# Patient Record
Sex: Female | Born: 1980 | Race: White | Hispanic: No | Marital: Single | Smoking: Never smoker
Health system: Southern US, Community
[De-identification: ages and names within clinical notes are randomized; demographics above are authoritative.]

---

## 2015-04-03 ENCOUNTER — Emergency Department (HOSPITAL_BASED_OUTPATIENT_CLINIC_OR_DEPARTMENT_OTHER): Payer: Self-pay

## 2015-04-03 ENCOUNTER — Emergency Department (HOSPITAL_BASED_OUTPATIENT_CLINIC_OR_DEPARTMENT_OTHER)
Admission: EM | Admit: 2015-04-03 | Discharge: 2015-04-03 | Disposition: A | Discharge status: Home / Self Care | Payer: Self-pay | Attending: Emergency Medicine | Admitting: Emergency Medicine

## 2015-04-03 ENCOUNTER — Encounter (HOSPITAL_BASED_OUTPATIENT_CLINIC_OR_DEPARTMENT_OTHER): Payer: Self-pay | Admitting: *Deleted

## 2015-04-03 DIAGNOSIS — W12XXXA Fall on and from scaffolding, initial encounter: Secondary | ICD-10-CM | POA: Insufficient documentation

## 2015-04-03 DIAGNOSIS — Y9289 Other specified places as the place of occurrence of the external cause: Secondary | ICD-10-CM | POA: Insufficient documentation

## 2015-04-03 DIAGNOSIS — Y9389 Activity, other specified: Secondary | ICD-10-CM | POA: Insufficient documentation

## 2015-04-03 DIAGNOSIS — S32020A Wedge compression fracture of second lumbar vertebra, initial encounter for closed fracture: Secondary | ICD-10-CM | POA: Insufficient documentation

## 2015-04-03 DIAGNOSIS — S52592A Other fractures of lower end of left radius, initial encounter for closed fracture: Secondary | ICD-10-CM | POA: Insufficient documentation

## 2015-04-03 DIAGNOSIS — Y990 Civilian activity done for income or pay: Secondary | ICD-10-CM | POA: Insufficient documentation

## 2015-04-03 DIAGNOSIS — S52502A Unspecified fracture of the lower end of left radius, initial encounter for closed fracture: Secondary | ICD-10-CM

## 2015-04-03 MED ORDER — FENTANYL CITRATE (PF) 100 MCG/2ML IJ SOLN
100.0000 ug | Freq: Once | INTRAMUSCULAR | Status: AC
  Administered 2015-04-03: 100 ug via NASAL
  Filled 2015-04-03: qty 2

## 2015-04-03 MED ORDER — OXYCODONE-ACETAMINOPHEN 5-325 MG PO TABS
1.0000 | ORAL_TABLET | Freq: Once | ORAL | Status: AC
  Administered 2015-04-03: 1 via ORAL
  Filled 2015-04-03: qty 1

## 2015-04-03 MED ORDER — CEPHALEXIN 250 MG PO CAPS
1000.0000 mg | ORAL_CAPSULE | Freq: Once | ORAL | Status: AC
  Administered 2015-04-03: 1000 mg via ORAL
  Filled 2015-04-03: qty 4

## 2015-04-03 MED ORDER — OXYCODONE-ACETAMINOPHEN 7.5-325 MG PO TABS: 1.0000 | ORAL_TABLET | ORAL | Status: AC | PRN

## 2015-04-03 MED ORDER — CEPHALEXIN 500 MG PO CAPS: 500.0000 mg | ORAL_CAPSULE | Freq: Three times a day (TID) | ORAL | Status: AC

## 2015-04-03 MED ORDER — OXYCODONE-ACETAMINOPHEN 5-325 MG PO TABS
2.0000 | ORAL_TABLET | Freq: Once | ORAL | Status: AC
  Administered 2015-04-03: 2 via ORAL
  Filled 2015-04-03: qty 2

## 2015-04-03 MED ORDER — HYDROMORPHONE HCL 2 MG PO TABS
2.0000 mg | ORAL_TABLET | Freq: Once | ORAL | Status: DC
  Filled 2015-04-03: qty 1

## 2015-04-03 NOTE — ED Provider Notes (Signed)
CSN: [id number]     Arrival date & time 04/03/15  0006 History   First MD Initiated Contact with Patient 04/03/15 0029     Chief Complaint  Patient presents with  . Fall     (Consider location/radiation/quality/duration/timing/severity/associated sxs/prior Treatment) Patient is a 35 y.o. female presenting with fall. The history is provided by the patient.  Fall  She isn't excited cancer and fell proximally 20 feet off of alcohol. She injured her left wrist and her lower back. She rates pain at 9/10. She denies other injury.  History reviewed. No pertinent past medical history. History reviewed. No pertinent past surgical history. History reviewed. No pertinent family history. Social History  Substance Use Topics  . Smoking status: Never Smoker   . Smokeless tobacco: None  . Alcohol Use: Yes   OB History    No data available     Review of Systems  All other systems reviewed and are negative.     Allergies  Review of patient's allergies indicates no known allergies.  Home Medications   Prior to Admission medications   Not on File   BP 122/70 mmHg  Pulse 84  Temp(Src) 98.7 F (37.1 C) (Oral)  Resp 16  Ht  (1.6 m)  Wt 126 lb (57.153 kg)  BMI 22.33 kg/m2  SpO2 100%  LMP 03/17/2015 Physical Exam  Nursing note and vitals reviewed.  35 year old female, resting comfortably and in no acute distress. Vital signs are normal. Oxygen saturation is 100%, which is normal. Head is normocephalic and atraumatic. PERRLA, EOMI. Oropharynx is clear. Neck is nontender and supple without adenopathy or JVD. Back is moderately tender in the mid and upper lumbar area without point tenderness. There is no CVA tenderness. Lungs are clear without rales, wheezes, or rhonchi. Chest is nontender. Heart has regular rate and rhythm without murmur. Abdomen is soft, flat, nontender without masses or hepatosplenomegaly and peristalsis is normoactive. Extremities: Deformity is present in  the left wrist with dorsal angulation. There is a 3 mm break in the skin on the ulnar aspect worrisome for open fracture. Distal neurovascular exam is intact with normal sensation, prompt capillary refill, normal strength of intrinsic muscles of the hand. Skin is warm and dry without rash. Neurologic: Mental status is normal, cranial nerves are intact, there are no motor or sensory deficits.  ED Course  .Splint Application Date/Time: 04/03/2015 2:00 AM Performed by: Dione Booze Authorized by: Preston Fleeting, Daquana Paddock Consent: Verbal consent obtained. Written consent not obtained. Risks and benefits: risks, benefits and alternatives were discussed Consent given by: patient Patient understanding: patient states understanding of the procedure being performed Patient consent: the patient's understanding of the procedure matches consent given Procedure consent: procedure consent matches procedure scheduled Relevant documents: relevant documents present and verified Test results: test results available and properly labeled Site marked: the operative site was marked Imaging studies: imaging studies available Required items: required blood products, implants, devices, and special equipment available Patient identity confirmed: verbally with patient and arm band Location details: left wrist Splint type: sugar tong Supplies used: cotton padding,  elastic bandage and Ortho-Glass Post-procedure: The splinted body part was neurovascularly unchanged following the procedure. Patient tolerance: Patient tolerated the procedure well with no immediate complications Comments: Splint applied by ED technician. I evaluated the neurovascular status following splint application.   (including critical care time)  Imaging Review Dg Wrist Complete Left  04/03/2015  CLINICAL DATA:  Patient status post fall from pole. Left wrist injury. EXAM: LEFT  WRIST - COMPLETE 3+ VIEW COMPARISON:  None. FINDINGS: There is a comminuted  foreshortened impacted fracture of the distal radius with intra-articular extension. There is dorsal displacement at the fracture site. Overlying soft tissue swelling. No evidence for associated acute fractures. IMPRESSION: Comminuted displaced and impacted fracture of the distal radius with intra-articular extension and dorsal displacement. Electronically Signed   By: Annia Belt M.D.   On: 04/03/2015 00:33   I have personally reviewed and evaluated these images as part of my medical decision-making.  MDM   Final diagnoses:  Fall from scaffold, initial encounter  Closed fracture of left distal radius, initial encounter  Traumatic compression fracture of L2 lumbar vertebra, closed, initial encounter (HCC)    Comminuted fracture of the distal right radius. Although there is a break in the skin, is on the ulnar side and there is no ulnar fracture so likely that of open fracture is very small. She is sent for lumbar spine x-ray because of complaint of low back pain.  Lumbar spine x-ray shows compression fracture of T2. She is also now complaining of pain along the iliac crests. This area was not viewed adequately with the lumbar spine x-rays. She is sent for CT of the lumbar spine to evaluate her fracture and plain x-rays of the iliac crest. 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 This mechanism of fall is consistent with the lumbar spine injury identified.  CT scan shows a burst fracture with some posterior retropulsion and probable small epidural hematoma but no evidence of cord or cauda equina compression. Pelvis x-ray is negative.  Patient is from [place], [place]. She once to go back there to receive her treatment. I discussed the case with Dr. Melvyn Novas of hand surgery service who felt that there was no contraindication to her traveling to [place] for delayed treatment  of her wrist fracture and states that open reduction internal fixation Be done week or more following injury without any deleterious effect. She was placed in a sugar tong splint and given a sling. Case was discussed with Dr. Conchita Paris, neurosurgeon, who reviewed her CT scan. I asked him specifically whether patient would be stable enough to take a commercial plane flight to [place] and whether there would be any danger and delaying treatment. He stated that treatment would normally be with TLSO brace and that there was not a contraindication to travel other than that she is likely to be uncomfortable while sitting. He stresses that the TLSO brace has to be worn anytime she is upright-either sitting or standing. She has a friend here who is going to drive her back to [place]. We attempted to get orthopedic rep to come to apply TLSO brace but this was not able to be accomplished for several hours. She is discharged with prescription to obtain TLSO brace and also prescription for oxycodone-acetaminophen. Advised on symptoms that might indicate possible spinal cord or cauda equina compression advised to go to an emergency department immediately if any of those were to occur and she expressed understanding.    Dione Booze, MD 04/03/15 [phone]

## 2015-04-03 NOTE — ED Notes (Signed)
Due to pt having fentanyl right before applying the sugar tong splint, she may or may not remember instructions, but instructions were also given to her visitor that was with her. CMS was intact before and after application of splint. The visitor said he understood and did not have any questions.

## 2015-04-03 NOTE — ED Notes (Signed)
Pt c/o fall from pole injuring left wrist, positive deformity

## 2015-04-03 NOTE — ED Notes (Signed)
Pt taken to XR but refused to have the xr's done until she gets more pain medicine.  Pt has reported minimal pain relief with previous doses and has cursed at the nurses saying that the pain medicine was no good.  MD notified.  Pt will be given another dose of Fentanyl per MD.

## 2015-04-03 NOTE — ED Notes (Signed)
Patient transported to X-ray 

## 2015-04-03 NOTE — Discharge Instructions (Signed)
Wear TLSO brace any time you are upright. Call to make appointments with your specialists back home.  If you develop weakness or numbness in your legs, or if you have any difficulty with your bowels or bladder, then go to the nearest emergency department immediately!  Wrist Fracture A wrist fracture is a break or crack in one of the bones of your wrist. Your wrist is made up of eight small bones at the palm of your hand (carpal bones) and two long bones that make up your forearm (radius and ulna). CAUSES  A direct blow to the wrist.  Falling on an outstretched hand.  Trauma, such as a car accident or a fall. RISK FACTORS Risk factors for wrist fracture include:  Participating in contact and high-risk sports, such as skiing, biking, and ice skating.  Taking steroid medicines.  Smoking.  Being female.  Being Caucasian.  Drinking more than three alcoholic beverages per day.  Having low or lowered bone density (osteoporosis or osteopenia).  Age. Older adults have decreased bone density.  Women who have had menopause.  History of previous fractures. SIGNS AND SYMPTOMS Symptoms of wrist fractures include tenderness, bruising, and inflammation. Additionally, the wrist may hang in an odd position or appear deformed. DIAGNOSIS Diagnosis may include:  Physical exam.  X-ray. TREATMENT Treatment depends on many factors, including the nature and location of the fracture, your age, and your activity level. Treatment for wrist fracture can be nonsurgical or surgical. Nonsurgical Treatment A plaster cast or splint may be applied to your wrist if the bone is in a good position. If the fracture is not in good position, it may be necessary for your health care provider to realign it before applying a splint or cast. Usually, a cast or splint will be worn for several weeks. Surgical Treatment Sometimes the position of the bone is so far out of place that surgery is required to apply a  device to hold it together as it heals. Depending on the fracture, there are a number of options for holding the bone in place while it heals, such as a cast and metal pins. HOME CARE INSTRUCTIONS  Keep your injured wrist elevated and move your fingers as much as possible.  Do not put pressure on any part of your cast or splint. It may break.  Use a plastic bag to protect your cast or splint from water while bathing or showering. Do not lower your cast or splint into water.  Take medicines only as directed by your health care provider.  Keep your cast or splint clean and dry. If it becomes wet, damaged, or suddenly feels too tight, contact your health care provider right away.  Do not use any tobacco products including cigarettes, chewing tobacco, or electronic cigarettes. Tobacco can delay bone healing. If you need help quitting, ask your health care provider.  Keep all follow-up visits as directed by your health care provider. This is important.  Ask your health care provider if you should take supplements of calcium and vitamins C and D to promote bone healing. SEEK MEDICAL CARE IF:  Your cast or splint is damaged, breaks, or gets wet.  You have a fever.  You have chills.  You have continued severe pain or more swelling than you did before the cast was put on. SEEK IMMEDIATE MEDICAL CARE IF:  Your hand or fingernails on the injured arm turn blue or gray, or feel cold or numb.  You have decreased feeling in the  fingers of your injured arm. MAKE SURE YOU:  Understand these instructions.  Will watch your condition.  Will get help right away if you are not doing well or get worse.   This information is not intended to replace advice given to you by your health care provider. Make sure you discuss any questions you have with your health care provider.   Document Released: 11/23/2004 Document Revised: 11/04/2014 Document Reviewed: 03/03/2011 Elsevier Interactive Patient  Education 2016 [place].  Spinal Compression Fracture A spinal compression fracture is a collapse of the bones that form the spine (vertebrae). With this type of fracture, the vertebrae become squashed (compressed) into a wedge shape. Most compression fractures happen in the middle or lower part of the spine. CAUSES This condition may be caused by:  Thinning and loss of density in the bones (osteoporosis). This is the most common cause.  A fall.  A car or motorcycle accident.  Cancer.  Trauma, such as a heavy, direct hit to the head. RISK FACTORS You may be at greater risk for a spinal compression fracture if you:  Are 30 years old or older.  Have osteoporosis.  Have certain types of cancer, including:  Multiple myeloma.  Lymphoma.  Prostate cancer.  Lung cancer.  Breast cancer. SYMPTOMS Symptoms of this condition include:  Severe pain.  Pain that gets worse over time.  Pain that is worse when you stand, walk, sit, or bend.  Sudden pain that is so bad that it is hard for you to move.  Bending or humping of the spine.  Gradual loss of height.  Numbness, tingling, or weakness in the back and legs.  Trouble walking. Your symptoms will depend on the cause of the fracture and how quickly it develops. For example, fractures that are caused by osteoporosis can cause few symptoms, no symptoms, or symptoms that develop slowly over time. DIAGNOSIS This condition may be diagnosed based on symptoms, medical history, and a physical exam. During the physical exam, your health care provider may tap along the length of your spine to check for tenderness. Tests may be done to confirm the diagnosis. They may include:  A bone density test to check for osteoporosis.  Imaging tests, such as a spine X-ray, a CT scan, or MRI. TREATMENT Treatment for this condition depends on the cause and severity of the condition.Some fractures, such as those that are caused by  osteoporosis, may heal on their own with supportive care. This may include:  Pain medicine.  Rest.  A back brace.  Physical therapy exercises.  Medicine that reduces bone pain.  Calcium and vitamin D supplements. Fractures that cause the back to become misshapen, cause nerve pain or weakness, or do not respond to other treatment may be treated with a surgical procedure, such as:  Vertebroplasty. In this procedure, bone cement is injected into the collapsed vertebrae to stabilize them.  Balloon kyphoplasty. In this procedure, the collapsed vertebrae are expanded with a balloon and then bone cement is injected into them.  Spinal fusion. In this procedure, the collapsed vertebrae are connected (fused) to normal vertebrae. HOME CARE INSTRUCTIONS General Instructions  Take medicines only as directed by your health care provider.  Do not drive or operate heavy machinery while taking pain medicine.  If directed, apply ice to the injured area:  Put ice in a plastic bag.  Place a towel between your skin and the bag.  Leave the ice on for 30 minutes every two hours at first.  Then apply the ice as needed.  Wear your neck brace or back brace as directed by your health care provider.  Do not drink alcohol. Alcohol can interfere with your treatment.  Keep all follow-up visits as directed by your health care provider. This is important. It can help to prevent permanent injury, disability, and long-lasting (chronic) pain. Activity  Stay in bed (on bed rest) only as directed by your health care provider. Being on bed rest for too long can make your condition worse.  Return to your normal activities as directed by your health care provider. Ask what activities are safe for you.  Do exercises to improve motion and strength in your back (physical therapy), as recommended by your health care provider.  Exercise regularly as directed by your health care provider. SEEK MEDICAL CARE  IF:  You have a fever.  You develop a cough that makes your pain worse.  Your pain medicine is not helping.  Your pain does not get better over time.  You cannot return to your normal activities as planned or expected. SEEK IMMEDIATE MEDICAL CARE IF:  Your pain is very bad and it suddenly gets worse.  You are unable to move any body part (paralysis) that is below the level of your injury.  You have numbness, tingling, or weakness in any body part that is below the level of your injury.  You cannot control your bladder or bowels.   This information is not intended to replace advice given to you by your health care provider. Make sure you discuss any questions you have with your health care provider.   Document Released: 02/13/2005 Document Revised: 06/30/2014 Document Reviewed: 02/17/2014 Elsevier Interactive Patient Education 2016 [place] or Splint Care Casts and splints support injured limbs and keep bones from moving while they heal. It is important to care for your cast or splint at home.  HOME CARE INSTRUCTIONS  Keep the cast or splint uncovered during the drying period. It can take 24 to 48 hours to dry if it is made of plaster. A fiberglass cast will dry in less than 1 hour.  Do not rest the cast on anything harder than a pillow for the first 24 hours.  Do not put weight on your injured limb or apply pressure to the cast until your health care provider gives you permission.  Keep the cast or splint dry. Wet casts or splints can lose their shape and may not support the limb as well. A wet cast that has lost its shape can also create harmful pressure on your skin when it dries. Also, wet skin can become infected.  Cover the cast or splint with a plastic bag when bathing or when out in the rain or snow. If the cast is on the trunk of the body, take sponge baths until the cast is removed.  If your cast does become wet, dry it with a towel or a blow dryer on  the cool setting only.  Keep your cast or splint clean. Soiled casts may be wiped with a moistened cloth.  Do not place any hard or soft foreign objects under your cast or splint, such as cotton, toilet paper, lotion, or powder.  Do not try to scratch the skin under the cast with any object. The object could get stuck inside the cast. Also, scratching could lead to an infection. If itching is a problem, use a blow dryer on a cool setting to relieve discomfort.  Do not trim or cut your cast or remove padding from inside of it.  Exercise all joints next to the injury that are not immobilized by the cast or splint. For example, if you have a long leg cast, exercise the hip joint and toes. If you have an arm cast or splint, exercise the shoulder, elbow, thumb, and fingers.  Elevate your injured arm or leg on 1 or 2 pillows for the first 1 to 3 days to decrease swelling and pain.It is best if you can comfortably elevate your cast so it is higher than your heart. SEEK MEDICAL CARE IF:   Your cast or splint cracks.  Your cast or splint is too tight or too loose.  You have unbearable itching inside the cast.  Your cast becomes wet or develops a soft spot or area.  You have a bad smell coming from inside your cast.  You get an object stuck under your cast.  Your skin around the cast becomes red or raw.  You have new pain or worsening pain after the cast has been applied. SEEK IMMEDIATE MEDICAL CARE IF:   You have fluid leaking through the cast.  You are unable to move your fingers or toes.  You have discolored (blue or white), cool, painful, or very swollen fingers or toes beyond the cast.  You have tingling or numbness around the injured area.  You have severe pain or pressure under the cast.  You have any difficulty with your breathing or have shortness of breath.  You have chest pain.   This information is not intended to replace advice given to you by your health care  provider. Make sure you discuss any questions you have with your health care provider.   Document Released: 02/11/2000 Document Revised: 12/04/2012 Document Reviewed: 08/22/2012 Elsevier Interactive Patient Education 2016 [employer]  Acetaminophen; Oxycodone tablets What is this medicine? ACETAMINOPHEN; OXYCODONE (a set a MEE noe fen; ox i KOE done) is a pain reliever. It is used to treat moderate to severe pain. This medicine may be used for other purposes; ask your health care provider or pharmacist if you have questions. What should I tell my health care provider before I take this medicine? They need to know if you have any of these conditions: -brain tumor -Crohn's disease, inflammatory bowel disease, or ulcerative colitis -drug abuse or addiction -head injury -heart or circulation problems -if you often drink alcohol -kidney disease or problems going to the bathroom -liver disease -lung disease, asthma, or breathing problems -an unusual or allergic reaction to acetaminophen, oxycodone, other opioid analgesics, other medicines, foods, dyes, or preservatives -pregnant or trying to get pregnant -breast-feeding How should I use this medicine? Take this medicine by mouth with a full glass of water. Follow the directions on the prescription label. You can take it with or without food. If it upsets your stomach, take it with food. Take your medicine at regular intervals. Do not take it more often than directed. Talk to your pediatrician regarding the use of this medicine in children. Special care may be needed. Patients over 66 years old may have a stronger reaction and need a smaller dose. Overdosage: If you think you have taken too much of this medicine contact a poison control center or emergency room at once. NOTE: This medicine is only for you. Do not share this medicine with others. What if I miss a dose? If you miss a dose, take it as soon as you can. If  it is almost time for  your next dose, take only that dose. Do not take double or extra doses. What may interact with this medicine? -alcohol -antihistamines -barbiturates like amobarbital, butalbital, butabarbital, methohexital, pentobarbital, phenobarbital, thiopental, and secobarbital -benztropine -drugs for bladder problems like solifenacin, trospium, oxybutynin, tolterodine, hyoscyamine, and methscopolamine -drugs for breathing problems like ipratropium and tiotropium -drugs for certain stomach or intestine problems like propantheline, homatropine methylbromide, glycopyrrolate, atropine, belladonna, and dicyclomine -general anesthetics like etomidate, ketamine, nitrous oxide, propofol, desflurane, enflurane, halothane, isoflurane, and sevoflurane -medicines for depression, anxiety, or psychotic disturbances -medicines for sleep -muscle relaxants -naltrexone -narcotic medicines (opiates) for pain -phenothiazines like perphenazine, thioridazine, chlorpromazine, mesoridazine, fluphenazine, prochlorperazine, promazine, and trifluoperazine -scopolamine -tramadol -trihexyphenidyl This list may not describe all possible interactions. Give your health care provider a list of all the medicines, herbs, non-prescription drugs, or dietary supplements you use. Also tell them if you smoke, drink alcohol, or use illegal drugs. Some items may interact with your medicine. What should I watch for while using this medicine? Tell your doctor or health care professional if your pain does not go away, if it gets worse, or if you have new or a different type of pain. You may develop tolerance to the medicine. Tolerance means that you will need a higher dose of the medication for pain relief. Tolerance is normal and is expected if you take this medicine for a long time. Do not suddenly stop taking your medicine because you may develop a severe reaction. Your body becomes used to the medicine. This does NOT mean you are addicted.  Addiction is a behavior related to getting and using a drug for a non-medical reason. If you have pain, you have a medical reason to take pain medicine. Your doctor will tell you how much medicine to take. If your doctor wants you to stop the medicine, the dose will be slowly lowered over time to avoid any side effects. You may get drowsy or dizzy. Do not drive, use machinery, or do anything that needs mental alertness until you know how this medicine affects you. Do not stand or sit up quickly, especially if you are an older patient. This reduces the risk of dizzy or fainting spells. Alcohol may interfere with the effect of this medicine. Avoid alcoholic drinks. There are different types of narcotic medicines (opiates) for pain. If you take more than one type at the same time, you may have more side effects. Give your health care provider a list of all medicines you use. Your doctor will tell you how much medicine to take. Do not take more medicine than directed. Call emergency for help if you have problems breathing. The medicine will cause constipation. Try to have a bowel movement at least every 2 to 3 days. If you do not have a bowel movement for 3 days, call your doctor or health care professional. Do not take Tylenol (acetaminophen) or medicines that have acetaminophen with this medicine. Too much acetaminophen can be very dangerous. Many nonprescription medicines contain acetaminophen. Always read the labels carefully to avoid taking more acetaminophen. What side effects may I notice from receiving this medicine? Side effects that you should report to your doctor or health care professional as soon as possible: -allergic reactions like skin rash, itching or hives, swelling of the face, lips, or tongue -breathing difficulties, wheezing -confusion -light headedness or fainting spells -severe stomach pain -unusually weak or tired -yellowing of the skin or the whites of the eyes Side effects that  usually do not require medical attention (report to your doctor or health care professional if they continue or are bothersome): -dizziness -drowsiness -nausea -vomiting This list may not describe all possible side effects. Call your doctor for medical advice about side effects. You may report side effects to FDA at 1-800-FDA-1088. Where should I keep my medicine? Keep out of the reach of children. This medicine can be abused. Keep your medicine in a safe place to protect it from theft. Do not share this medicine with anyone. Selling or giving away this medicine is dangerous and against the law. This medicine may cause accidental overdose and death if it taken by other adults, children, or pets. Mix any unused medicine with a substance like cat litter or coffee grounds. Then throw the medicine away in a sealed container like a sealed bag or a coffee can with a lid. Do not use the medicine after the expiration date. Store at room temperature between 20 and 25 degrees C (68 and 77 degrees F). NOTE: This sheet is a summary. It may not cover all possible information. If you have questions about this medicine, talk to your doctor, pharmacist, or health care provider.    2016, Elsevier/[employer]. (2014-01-14 15:18:46)  Cephalexin tablets or capsules What is this medicine? CEPHALEXIN (sef a LEX in) is a cephalosporin antibiotic. It is used to treat certain kinds of bacterial infections It will not work for colds, flu, or other viral infections. This medicine may be used for other purposes; ask your health care provider or pharmacist if you have questions. What should I tell my health care provider before I take this medicine? They need to know if you have any of these conditions: -kidney disease -stomach or intestine problems, especially colitis -an unusual or allergic reaction to cephalexin, other cephalosporins, penicillins, other antibiotics, medicines, foods, dyes or preservatives -pregnant  or trying to get pregnant -breast-feeding How should I use this medicine? Take this medicine by mouth with a full glass of water. Follow the directions on the prescription label. This medicine can be taken with or without food. Take your medicine at regular intervals. Do not take your medicine more often than directed. Take all of your medicine as directed even if you think you are better. Do not skip doses or stop your medicine early. Talk to your pediatrician regarding the use of this medicine in children. While this drug may be prescribed for selected conditions, precautions do apply. Overdosage: If you think you have taken too much of this medicine contact a poison control center or emergency room at once. NOTE: This medicine is only for you. Do not share this medicine with others. What if I miss a dose? If you miss a dose, take it as soon as you can. If it is almost time for your next dose, take only that dose. Do not take double or extra doses. There should be at least 4 to 6 hours between doses. What may interact with this medicine? -probenecid -some other antibiotics This list may not describe all possible interactions. Give your health care provider a list of all the medicines, herbs, non-prescription drugs, or dietary supplements you use. Also tell them if you smoke, drink alcohol, or use illegal drugs. Some items may interact with your medicine. What should I watch for while using this medicine? Tell your doctor or health care professional if your symptoms do not begin to improve in a few days. Do not treat diarrhea with over the counter products. Contact  your doctor if you have diarrhea that lasts more than 2 days or if it is severe and watery. If you have diabetes, you may get a false-positive result for sugar in your urine. Check with your doctor or health care professional. What side effects may I notice from receiving this medicine? Side effects that you should report to your doctor  or health care professional as soon as possible: -allergic reactions like skin rash, itching or hives, swelling of the face, lips, or tongue -breathing problems -pain or trouble passing urine -redness, blistering, peeling or loosening of the skin, including inside the mouth -severe or watery diarrhea -unusually weak or tired -yellowing of the eyes, skin Side effects that usually do not require medical attention (report to your doctor or health care professional if they continue or are bothersome): -gas or heartburn -genital or anal irritation -headache -joint or muscle pain -nausea, vomiting This list may not describe all possible side effects. Call your doctor for medical advice about side effects. You may report side effects to FDA at 1-800-FDA-1088. Where should I keep my medicine? Keep out of the reach of children. Store at room temperature between 59 and 86 degrees F (15 and 30 degrees C). Throw away any unused medicine after the expiration date. NOTE: This sheet is a summary. It may not cover all possible information. If you have questions about this medicine, talk to your doctor, pharmacist, or health care provider.    2016, [employer]. (2007-05-20 17:09:13)

## 2016-08-17 IMAGING — CT CT L SPINE W/O CM
3 series · 13 of 33 positions shown, 16 images · non-contrast
Comparison: None.

CLINICAL DATA: Low back pain after fall.  Initial encounter.

EXAM:
CT LUMBAR SPINE WITHOUT CONTRAST
TECHNIQUE: Multidetector CT imaging of the lumbar spine was performed without
intravenous contrast administration. Multiplanar CT image
reconstructions were also generated.

[Series 3: l spine soft · axial · 0.28mm/px · z∈[+850,+996]mm · 5 of 107 slices shown, 7 images]
[im 17/107  soft-tissue]
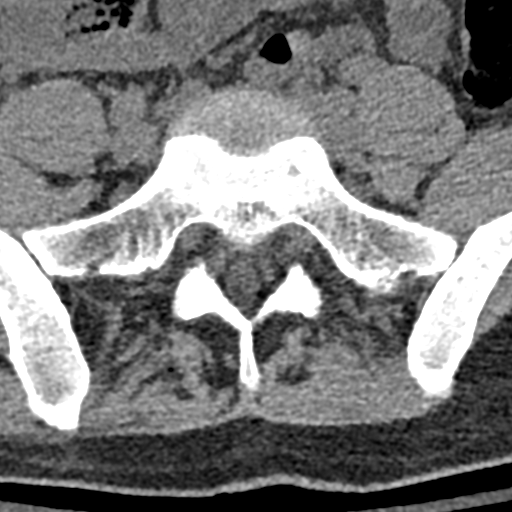
[im 17/107  bone]
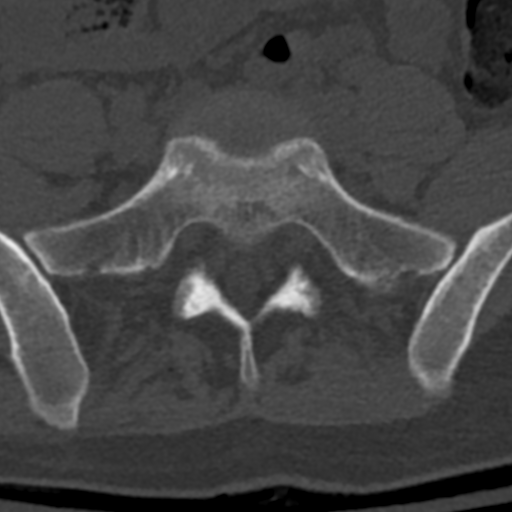
[im 33/107  bone]
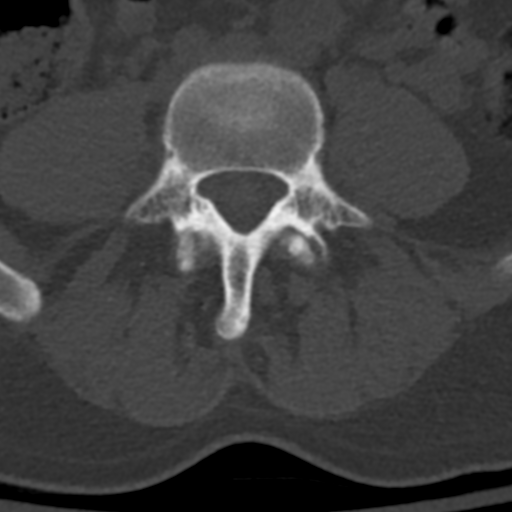
[im 58/107  bone]
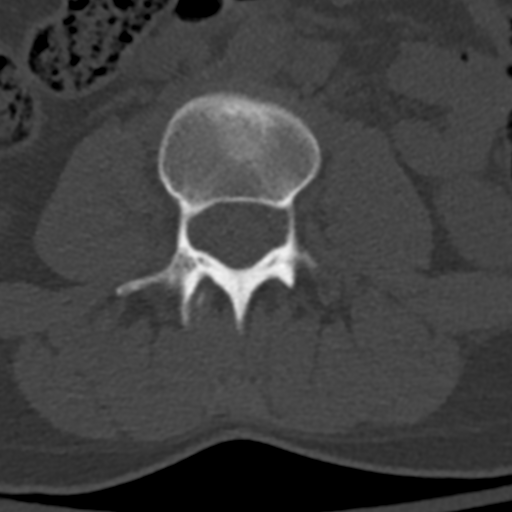
[im 74/107  bone]
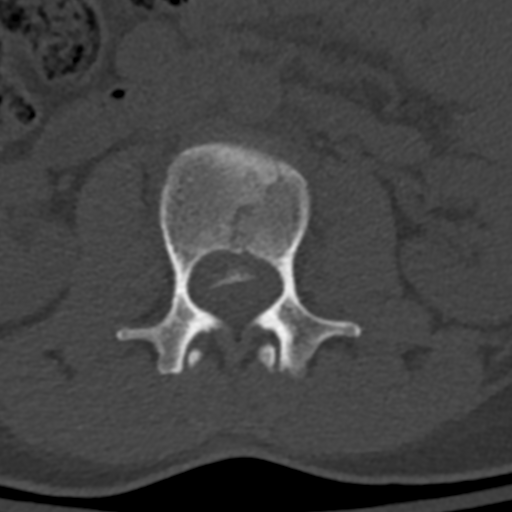
[im 90/107  soft-tissue]
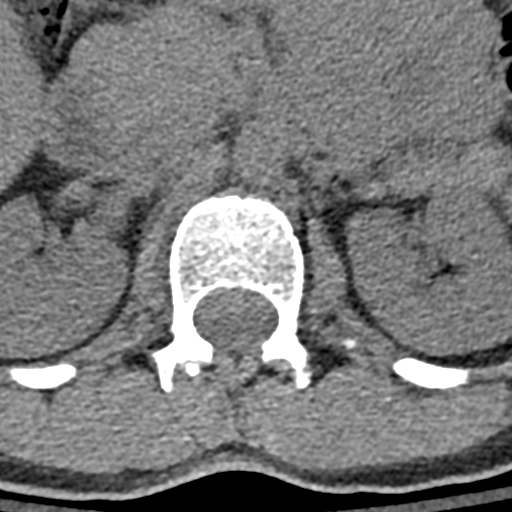
[im 90/107  bone]
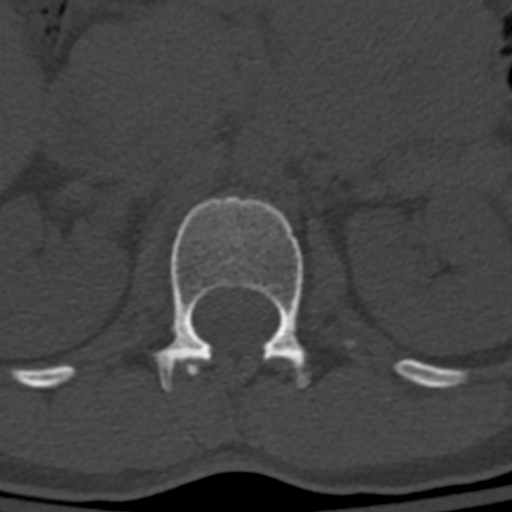

[Series 4: sagittal bone · sagittal · 0.29mm/px · 5 of 61 slices shown, 6 images]
[im 21/61  bone]
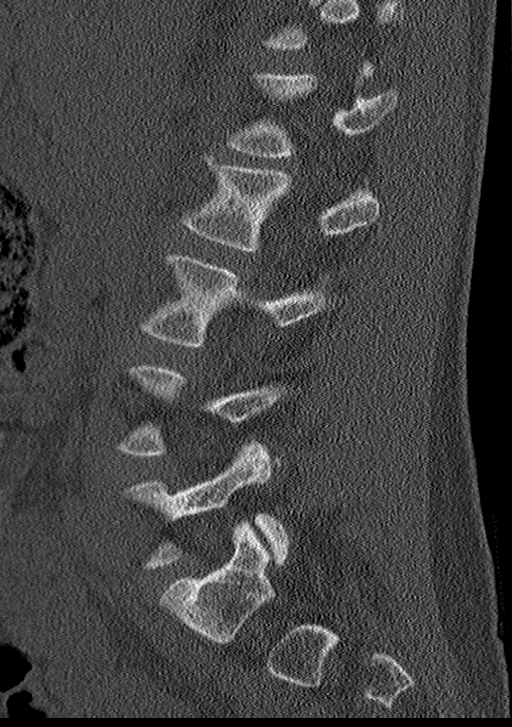
[im 26/61  bone]
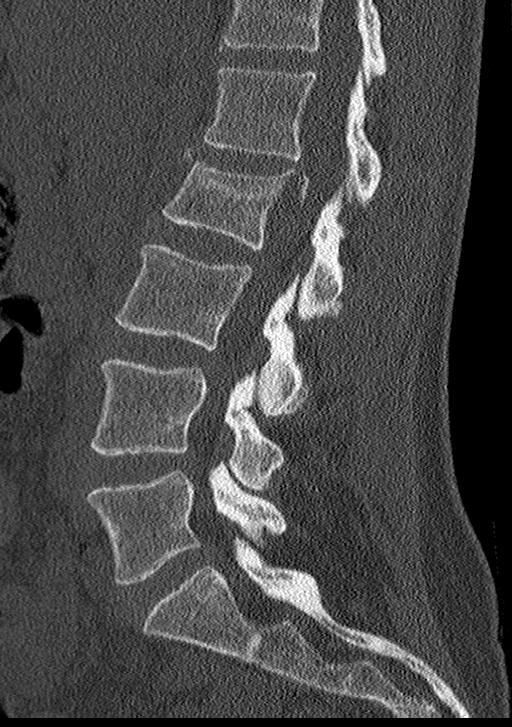
[im 31/61  soft-tissue]
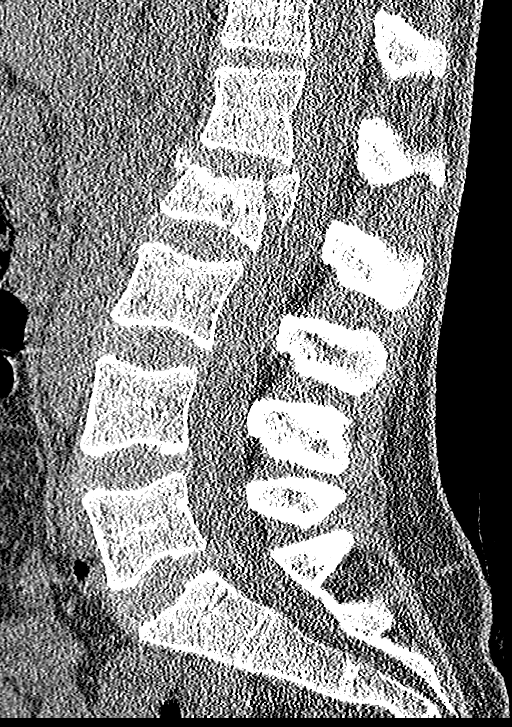
[im 31/61  bone]
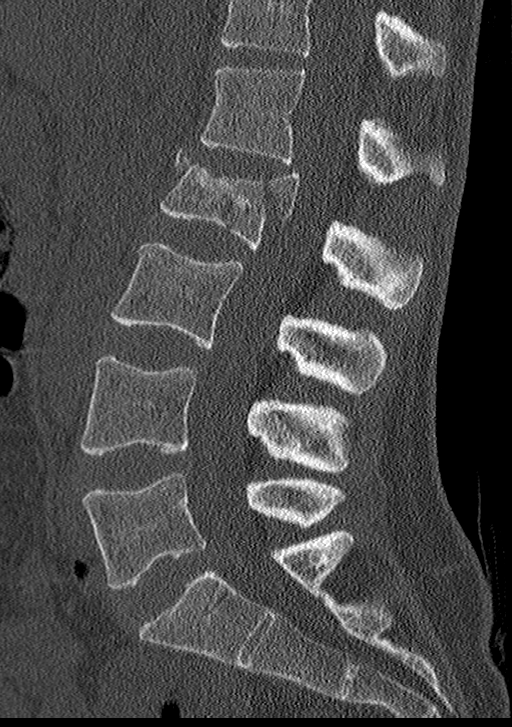
[im 36/61  bone]
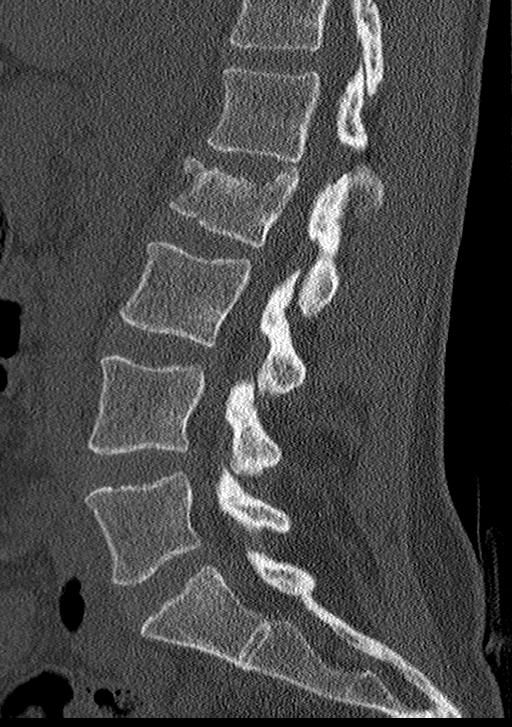
[im 41/61  bone]
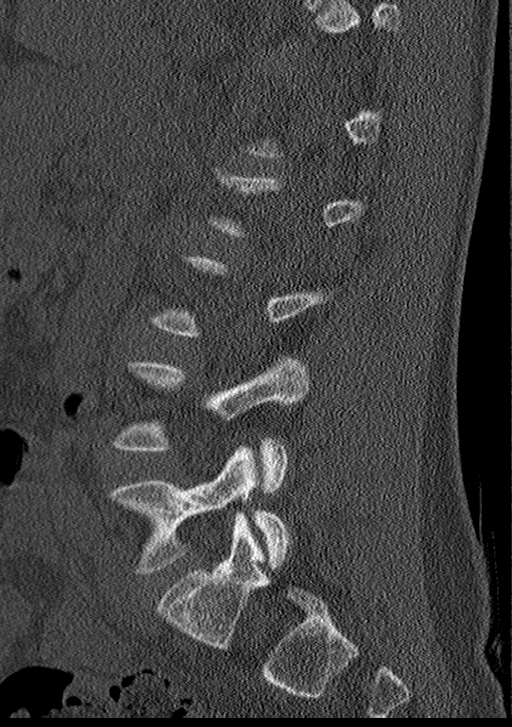

[Series 5: coronal bone · coronal · 0.31mm/px · 3 of 67 slices shown]
[im 14/67  bone]
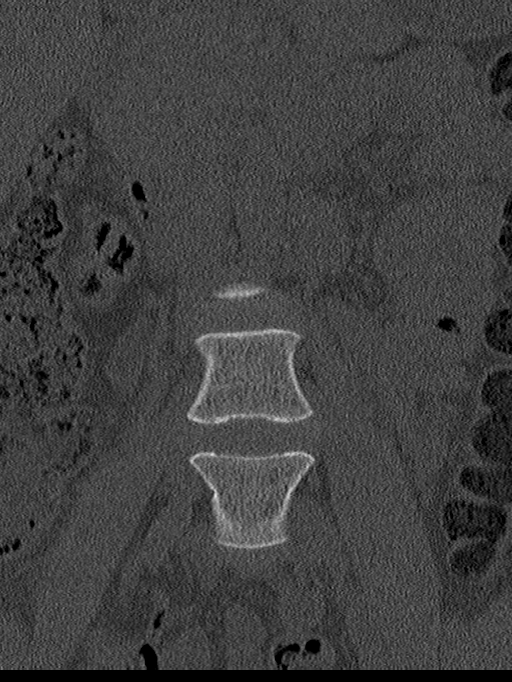
[im 27/67  bone]
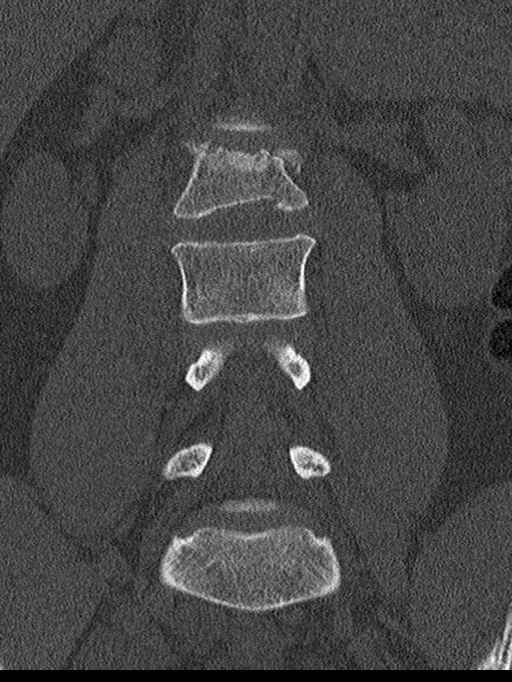
[im 40/67  bone]
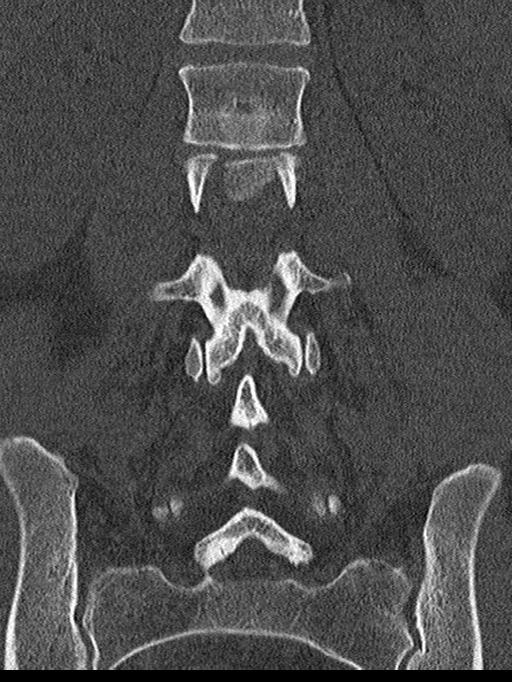

[13 of 33 positions shown; findings below may reference images not displayed]

FINDINGS: Comminuted L2 body fracture with left worse than right depression
measuring 50%. Bony retropulsion is moderate, greatest at the right
subarticular recess. There is mass effect on the thecal sac, but
minimal AP canal diameter is 11 mm in the midline.

The L3 fracture reported on the previous radiographs is not
convincing on this study, and appears more like spondylotic spur.

No posterior element fracture or subluxation.

Crescentic high density present along the ventral thecal sac from
the level of the fracture to the lumbosacral junction. Shaped
suggests an anterior subdural hematoma, up to 5 mm in thickness. MRI
could confirm and evaluate, if clinically warranted.

Edema around the fracture.  No visualized visceral injury.

No significant degenerative change.
IMPRESSION: 1. Comminuted L2 body fracture with 50% height loss and moderate
retropulsion. Retropulsed fragment primarily narrows the right
subarticular recess.
2. Suspected subdural hematoma below the fracture.
3. L3 body fracture reported on radiography is not convincing on
this study, more likely spurring.
